# Patient Record
Sex: Female | Born: 2005 | Race: White | Hispanic: No | Marital: Single | Smoking: Never smoker
Health system: Southern US, Community
[De-identification: ages and names within clinical notes are randomized; demographics above are authoritative.]

## PROBLEM LIST (undated history)

## (undated) DIAGNOSIS — R109 Unspecified abdominal pain: Secondary | ICD-10-CM

## (undated) DIAGNOSIS — J45909 Unspecified asthma, uncomplicated: Secondary | ICD-10-CM

## (undated) DIAGNOSIS — R7989 Other specified abnormal findings of blood chemistry: Secondary | ICD-10-CM

## (undated) DIAGNOSIS — L309 Dermatitis, unspecified: Secondary | ICD-10-CM

## (undated) DIAGNOSIS — M0820 Juvenile rheumatoid arthritis with systemic onset, unspecified site: Secondary | ICD-10-CM

## (undated) DIAGNOSIS — M0890 Juvenile arthritis, unspecified, unspecified site: Secondary | ICD-10-CM

## (undated) DIAGNOSIS — D649 Anemia, unspecified: Secondary | ICD-10-CM

## (undated) HISTORY — DX: Unspecified asthma, uncomplicated: J45.909

## (undated) HISTORY — DX: Dermatitis, unspecified: L30.9

## (undated) HISTORY — DX: Juvenile rheumatoid arthritis with systemic onset, unspecified site: M08.20

## (undated) HISTORY — DX: Anemia, unspecified: D64.9

## (undated) HISTORY — DX: Other specified abnormal findings of blood chemistry: R79.89

## (undated) HISTORY — DX: Unspecified abdominal pain: R10.9

---
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
## 2023-05-22 ENCOUNTER — Other Ambulatory Visit: Payer: Self-pay | Admitting: Obstetrics & Gynecology

## 2023-05-22 DIAGNOSIS — T8339XA Other mechanical complication of intrauterine contraceptive device, initial encounter: Secondary | ICD-10-CM

## 2023-06-26 ENCOUNTER — Ambulatory Visit
Admission: RE | Admit: 2023-06-26 | Discharge: 2023-06-26 | Disposition: A | Discharge status: Home / Self Care | Payer: BC Managed Care – PPO | Source: Ambulatory Visit | Attending: Obstetrics & Gynecology | Admitting: Obstetrics & Gynecology

## 2023-06-26 DIAGNOSIS — T8339XA Other mechanical complication of intrauterine contraceptive device, initial encounter: Secondary | ICD-10-CM | POA: Insufficient documentation
